# Patient Record
Sex: Female | Born: 1945 | Race: Black or African American | Hispanic: No
Health system: Southern US, Community
[De-identification: ages and names within clinical notes are randomized; demographics above are authoritative.]

## PROBLEM LIST (undated history)

## (undated) DIAGNOSIS — C50919 Malignant neoplasm of unspecified site of unspecified female breast: Secondary | ICD-10-CM

## (undated) DIAGNOSIS — E785 Hyperlipidemia, unspecified: Secondary | ICD-10-CM

---

## 2021-03-09 ENCOUNTER — Emergency Department (HOSPITAL_COMMUNITY)
Admission: EM | Admit: 2021-03-09 | Discharge: 2021-03-09 | Disposition: A | Discharge status: Home / Self Care | Payer: Medicare Other | Attending: Emergency Medicine | Admitting: Emergency Medicine

## 2021-03-09 ENCOUNTER — Emergency Department (HOSPITAL_COMMUNITY): Payer: Medicare Other

## 2021-03-09 ENCOUNTER — Encounter (HOSPITAL_COMMUNITY): Payer: Self-pay

## 2021-03-09 ENCOUNTER — Other Ambulatory Visit: Payer: Self-pay

## 2021-03-09 DIAGNOSIS — Y92410 Unspecified street and highway as the place of occurrence of the external cause: Secondary | ICD-10-CM | POA: Insufficient documentation

## 2021-03-09 DIAGNOSIS — M79622 Pain in left upper arm: Secondary | ICD-10-CM | POA: Insufficient documentation

## 2021-03-09 DIAGNOSIS — Z853 Personal history of malignant neoplasm of breast: Secondary | ICD-10-CM | POA: Diagnosis not present

## 2021-03-09 DIAGNOSIS — S0033XA Contusion of nose, initial encounter: Secondary | ICD-10-CM | POA: Diagnosis not present

## 2021-03-09 DIAGNOSIS — S0990XA Unspecified injury of head, initial encounter: Secondary | ICD-10-CM

## 2021-03-09 DIAGNOSIS — S0992XA Unspecified injury of nose, initial encounter: Secondary | ICD-10-CM | POA: Diagnosis present

## 2021-03-09 DIAGNOSIS — R0781 Pleurodynia: Secondary | ICD-10-CM | POA: Diagnosis not present

## 2021-03-09 DIAGNOSIS — M79662 Pain in left lower leg: Secondary | ICD-10-CM | POA: Diagnosis not present

## 2021-03-09 DIAGNOSIS — M542 Cervicalgia: Secondary | ICD-10-CM | POA: Insufficient documentation

## 2021-03-09 HISTORY — DX: Malignant neoplasm of unspecified site of unspecified female breast: C50.919

## 2021-03-09 HISTORY — DX: Hyperlipidemia, unspecified: E78.5

## 2021-03-09 MED ORDER — ACETAMINOPHEN 500 MG PO TABS
1000.0000 mg | ORAL_TABLET | Freq: Once | ORAL | Status: AC
  Administered 2021-03-09: 1000 mg via ORAL
  Filled 2021-03-09: qty 2

## 2021-03-09 MED ORDER — BACITRACIN ZINC 500 UNIT/GM EX OINT: 1.0000 "application " | TOPICAL_OINTMENT | Freq: Two times a day (BID) | CUTANEOUS | Status: DC

## 2021-03-09 MED ORDER — KETOROLAC TROMETHAMINE 60 MG/2ML IM SOLN
60.0000 mg | Freq: Once | INTRAMUSCULAR | Status: AC
  Administered 2021-03-09: 60 mg via INTRAMUSCULAR
  Filled 2021-03-09: qty 2

## 2021-03-09 MED ORDER — CYCLOBENZAPRINE HCL 10 MG PO TABS: 10.0000 mg | ORAL_TABLET | Freq: Two times a day (BID) | ORAL | 0 refills | Status: AC | PRN

## 2021-03-09 MED ORDER — NAPROXEN 500 MG PO TABS: 500.0000 mg | ORAL_TABLET | Freq: Two times a day (BID) | ORAL | 0 refills | Status: AC

## 2021-03-09 NOTE — Discharge Instructions (Addendum)
Thankfully your x-rays do not show any signs of broken bones, no brain injury, no spine injury, no facial fractures  You do have multiple bumps bruises and even a blister over your left elbow.  Please keep a sterile dressing on the left elbow with a topical antibiotic ointment of your choice  Naprosyn twice a day as needed for pain  Flexeril twice a day as needed for muscle spasms  Return to the emergency department for severe or worsening symptoms  Thank you for letting us take care of you today!  Please obtain all of your results from medical records or have your doctors office obtain the results - share them with your doctor - you should be seen at your doctors office in the next 2 days. Call today to arrange your follow up. Take the medications as prescribed. Please review all of the medicines and only take them if you do not have an allergy to them. Please be aware that if you are taking birth control pills, taking other prescriptions, ESPECIALLY ANTIBIOTICS may make the birth control ineffective - if this is the case, either do not engage in sexual activity or use alternative methods of birth control such as condoms until you have finished the medicine and your family doctor says it is OK to restart them. If you are on a blood thinner such as COUMADIN, be aware that any other medicine that you take may cause the coumadin to either work too much, or not enough - you should have your coumadin level rechecked in next 7 days if this is the case.  ?  It is also a possibility that you have an allergic reaction to any of the medicines that you have been prescribed - Everybody reacts differently to medications and while MOST people have no trouble with most medicines, you may have a reaction such as nausea, vomiting, rash, swelling, shortness of breath. If this is the case, please stop taking the medicine immediately and contact your physician.   If you were given a medication in the ED such as  percocet, vicodin, or morphine, be aware that these medicines are sedating and may change your ability to take care of yourself adequately for several hours after being given this medicines - you should not drive or take care of small children if you were given this medicine in the Emergency Department or if you have been prescribed these types of medicines. ?   You should return to the ER IMMEDIATELY if you develop severe or worsening symptoms.
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
